# Patient Record
Sex: Female | Born: 1965 | Race: White | Hispanic: No | Marital: Single | Smoking: Current every day smoker
Health system: Southern US, Community
[De-identification: ages and names within clinical notes are randomized; demographics above are authoritative.]

## PROBLEM LIST (undated history)

## (undated) HISTORY — PX: SKIN GRAFT: SHX250

## (undated) HISTORY — PX: APPENDECTOMY: SHX54

---

## 2006-12-05 ENCOUNTER — Emergency Department (HOSPITAL_COMMUNITY): Admission: EM | Admit: 2006-12-05 | Discharge: 2006-12-05 | Payer: Self-pay | Admitting: Emergency Medicine

## 2007-03-09 ENCOUNTER — Emergency Department (HOSPITAL_COMMUNITY): Admission: EM | Admit: 2007-03-09 | Discharge: 2007-03-09 | Payer: Self-pay | Admitting: Emergency Medicine

## 2009-03-07 ENCOUNTER — Emergency Department (HOSPITAL_COMMUNITY): Admission: EM | Admit: 2009-03-07 | Discharge: 2009-03-07 | Payer: Self-pay | Admitting: Emergency Medicine

## 2009-03-10 ENCOUNTER — Emergency Department (HOSPITAL_COMMUNITY): Admission: EM | Admit: 2009-03-10 | Discharge: 2009-03-10 | Payer: Self-pay | Admitting: Family Medicine

## 2009-03-14 ENCOUNTER — Emergency Department (HOSPITAL_COMMUNITY): Admission: EM | Admit: 2009-03-14 | Discharge: 2009-03-14 | Payer: Self-pay | Admitting: Family Medicine

## 2009-03-21 ENCOUNTER — Emergency Department (HOSPITAL_COMMUNITY): Admission: EM | Admit: 2009-03-21 | Discharge: 2009-03-21 | Payer: Self-pay | Admitting: Emergency Medicine

## 2010-10-16 LAB — POCT URINE HEMOGLOBIN
Hgb urine dipstick: POSITIVE — AB
Operator id: 231701

## 2010-10-16 LAB — POCT PREGNANCY, URINE: Operator id: 231701

## 2014-06-01 ENCOUNTER — Emergency Department (HOSPITAL_BASED_OUTPATIENT_CLINIC_OR_DEPARTMENT_OTHER)
Admission: EM | Admit: 2014-06-01 | Discharge: 2014-06-01 | Disposition: A | Discharge status: Home / Self Care | Payer: Self-pay | Attending: Emergency Medicine | Admitting: Emergency Medicine

## 2014-06-01 ENCOUNTER — Encounter (HOSPITAL_BASED_OUTPATIENT_CLINIC_OR_DEPARTMENT_OTHER): Payer: Self-pay | Admitting: *Deleted

## 2014-06-01 DIAGNOSIS — R110 Nausea: Secondary | ICD-10-CM | POA: Insufficient documentation

## 2014-06-01 DIAGNOSIS — R197 Diarrhea, unspecified: Secondary | ICD-10-CM | POA: Insufficient documentation

## 2014-06-01 DIAGNOSIS — Z3202 Encounter for pregnancy test, result negative: Secondary | ICD-10-CM | POA: Insufficient documentation

## 2014-06-01 DIAGNOSIS — M791 Myalgia, unspecified site: Secondary | ICD-10-CM

## 2014-06-01 DIAGNOSIS — R51 Headache: Secondary | ICD-10-CM | POA: Insufficient documentation

## 2014-06-01 DIAGNOSIS — R5383 Other fatigue: Secondary | ICD-10-CM

## 2014-06-01 DIAGNOSIS — Z720 Tobacco use: Secondary | ICD-10-CM | POA: Insufficient documentation

## 2014-06-01 DIAGNOSIS — R6883 Chills (without fever): Secondary | ICD-10-CM | POA: Insufficient documentation

## 2014-06-01 DIAGNOSIS — R531 Weakness: Secondary | ICD-10-CM | POA: Insufficient documentation

## 2014-06-01 LAB — CBC WITH DIFFERENTIAL/PLATELET
BASOS ABS: 0.1 10*3/uL (ref 0.0–0.1)
Basophils Relative: 1 % (ref 0–1)
EOS PCT: 2 % (ref 0–5)
Eosinophils Absolute: 0.1 10*3/uL (ref 0.0–0.7)
HEMATOCRIT: 41.1 % (ref 36.0–46.0)
HEMOGLOBIN: 15 g/dL (ref 12.0–15.0)
LYMPHS PCT: 50 % — AB (ref 12–46)
Lymphs Abs: 4 10*3/uL (ref 0.7–4.0)
MCH: 38.2 pg — AB (ref 26.0–34.0)
MCHC: 36.5 g/dL — ABNORMAL HIGH (ref 30.0–36.0)
MCV: 104.6 fL — ABNORMAL HIGH (ref 78.0–100.0)
MONO ABS: 1 10*3/uL (ref 0.1–1.0)
MONOS PCT: 12 % (ref 3–12)
NEUTROS PCT: 35 % — AB (ref 43–77)
Neutro Abs: 2.8 10*3/uL (ref 1.7–7.7)
PLATELETS: 301 10*3/uL (ref 150–400)
RBC: 3.93 MIL/uL (ref 3.87–5.11)
RDW: 11.9 % (ref 11.5–15.5)
WBC: 7.9 10*3/uL (ref 4.0–10.5)

## 2014-06-01 LAB — URINE MICROSCOPIC-ADD ON

## 2014-06-01 LAB — COMPREHENSIVE METABOLIC PANEL
ALBUMIN: 4.2 g/dL (ref 3.5–5.0)
ALK PHOS: 79 U/L (ref 38–126)
ALT: 51 U/L (ref 14–54)
AST: 39 U/L (ref 15–41)
Anion gap: 9 (ref 5–15)
BUN: 15 mg/dL (ref 6–20)
CALCIUM: 9.3 mg/dL (ref 8.9–10.3)
CHLORIDE: 106 mmol/L (ref 101–111)
CO2: 24 mmol/L (ref 22–32)
Creatinine, Ser: 0.62 mg/dL (ref 0.44–1.00)
GFR calc Af Amer: >60 mL/min (ref >60)
GLUCOSE: 102 mg/dL — AB (ref 65–99)
Potassium: 4.1 mmol/L (ref 3.5–5.1)
Sodium: 139 mmol/L (ref 135–145)
TOTAL PROTEIN: 8.4 g/dL — AB (ref 6.5–8.1)
Total Bilirubin: 0.5 mg/dL (ref 0.3–1.2)

## 2014-06-01 LAB — URINALYSIS, ROUTINE W REFLEX MICROSCOPIC
Glucose, UA: NEGATIVE mg/dL
Hgb urine dipstick: NEGATIVE
Ketones, ur: 15 mg/dL — AB
Nitrite: NEGATIVE
PH: 6 (ref 5.0–8.0)
Protein, ur: NEGATIVE mg/dL
SPECIFIC GRAVITY, URINE: 1.038 — AB (ref 1.005–1.030)
UROBILINOGEN UA: 1 mg/dL (ref 0.0–1.0)

## 2014-06-01 LAB — PREGNANCY, URINE: PREG TEST UR: NEGATIVE

## 2014-06-01 MED ORDER — DOXYCYCLINE HYCLATE 100 MG PO CAPS: 100.0000 mg | ORAL_CAPSULE | Freq: Two times a day (BID) | ORAL | Status: DC

## 2014-06-01 NOTE — ED Provider Notes (Signed)
CSN: [id number]     Arrival date & time 06/01/14  1735 History   First MD Initiated Contact with Patient 06/01/14 1756     Chief Complaint  Patient presents with  . Generalized Body Aches     Patient is a 49 y.o. female presenting with weakness. The history is provided by the patient.  Weakness This is a new problem. The current episode started more than 2 days ago. The problem occurs daily. The problem has been gradually worsening. Associated symptoms include headaches. Pertinent negatives include no chest pain and no shortness of breath. The symptoms are aggravated by walking. The symptoms are relieved by rest.  pt reports fatigue, weakness, mild HA and "no energy" for past 4 days No fever/vomiting She reports nausea No CP is reported She reports tick bite about 10 days ago and symptoms started after that She has no other medical conditions   PMH- none  Past Surgical History  Procedure Laterality Date  . Skin graft    . Appendectomy     History reviewed. No pertinent family history. History  Substance Use Topics  . Smoking status: Current Every Day Smoker -- 0.50 packs/day    Types: Cigarettes  . Smokeless tobacco: Not on file  . Alcohol Use: No   OB History    No data available     Review of Systems  Constitutional: Positive for chills and fatigue.  Respiratory: Negative for cough and shortness of breath.   Cardiovascular: Negative for chest pain.  Gastrointestinal: Positive for diarrhea. Negative for vomiting.  Genitourinary: Negative for vaginal bleeding.  Neurological: Positive for weakness and headaches.  All other systems reviewed and are negative.     Allergies  Asa  Home Medications   Prior to Admission medications   Medication Sig Start Date End Date Taking? Authorizing Provider  acetaminophen (TYLENOL) 325 MG tablet Take 650 mg by mouth every 6 (six) hours as needed.   Yes Historical Provider, MD   BP 134/87 mmHg  Pulse 114  Temp(Src) 98.7 F  (37.1 C) (Oral)  Resp 16  Ht 5\' 6"  (1.676 m)  Wt 175 lb (79.379 kg)  BMI 28.26 kg/m2  SpO2 100% Physical Exam CONSTITUTIONAL: Well developed/well nourished HEAD: Normocephalic/atraumatic EYES: EOMI/PERRL ENMT: Mucous membranes moist NECK: supple no meningeal signs SPINE/BACK:entire spine nontender CV: S1/S2 noted, no murmurs/rubs/gallops noted LUNGS: Lungs are clear to auscultation bilaterally, no apparent distress ABDOMEN: soft, nontender, no rebound or guarding, bowel sounds noted throughout abdomen GU:no cva tenderness NEURO: Pt is awake/alert/appropriate, moves all extremitiesx4.  No facial droop.  No arm/leg drift.  No ataxia noted EXTREMITIES: pulses normal/equal, full ROM SKIN: warm, color normal PSYCH: no abnormalities of mood noted, alert and oriented to situation  ED Course  Procedures   7:05 PM Screening labs for fatigue ordered If negative will start treatment for possible RMSF, titers pending  Urine culture sent Pt well appearing and stable for d/c home   Labs Review Labs Reviewed  CBC WITH DIFFERENTIAL/PLATELET - Abnormal; Notable for the following:    MCV 104.6 (*)    MCH 38.2 (*)    MCHC 36.5 (*)    Neutrophils Relative % 35 (*)    Lymphocytes Relative 50 (*)    All other components within normal limits  COMPREHENSIVE METABOLIC PANEL - Abnormal; Notable for the following:    Glucose, Bld 102 (*)    Total Protein 8.4 (*)    All other components within normal limits  URINALYSIS, ROUTINE W REFLEX MICROSCOPIC (  NOT AT [facility]) - Abnormal; Notable for the following:    APPearance CLOUDY (*)    Specific Gravity, Urine 1.038 (*)    Bilirubin Urine SMALL (*)    Ketones, ur 15 (*)    Leukocytes, UA LARGE (*)    All other components within normal limits  URINE MICROSCOPIC-ADD ON - Abnormal; Notable for the following:    Squamous Epithelial / LPF MANY (*)    Bacteria, UA FEW (*)    All other components within normal limits  PREGNANCY, URINE  ROCKY MTN  SPOTTED FVR ABS PNL(IGG+IGM)      EKG Interpretation   Date/Time:  Wednesday Jun 01 2014 18:31:05 EDT Ventricular Rate:  89 PR Interval:  140 QRS Duration: 84 QT Interval:  362 QTC Calculation: 440 R Axis:   24 Text Interpretation:  Normal sinus rhythm Non-specific ST-t changes No  previous ECGs available Confirmed by Bebe Shaggy  MD, Dorinda Hill ([id number]) on  06/01/2014 6:57:55 PM      MDM   Final diagnoses:  Myalgia  Other fatigue    Nursing notes including past medical history and social history reviewed and considered in documentation Labs/vital reviewed myself and considered during evaluation     Zadie Rhine, MD 06/01/14 1924

## 2014-06-01 NOTE — ED Notes (Signed)
Pt c/o generalized body aches fever and chills x 4 days, tick removed x 10 days ago

## 2014-06-01 NOTE — Discharge Instructions (Signed)

## 2014-06-02 LAB — ROCKY MTN SPOTTED FVR ABS PNL(IGG+IGM)
RMSF IGM: 0.59 {index} (ref 0.00–0.89)
RMSF IgG: NEGATIVE

## 2014-06-03 LAB — URINE CULTURE: Colony Count: 50000

## 2015-05-17 ENCOUNTER — Emergency Department (HOSPITAL_BASED_OUTPATIENT_CLINIC_OR_DEPARTMENT_OTHER)
Admission: EM | Admit: 2015-05-17 | Discharge: 2015-05-17 | Disposition: A | Discharge status: Home / Self Care | Payer: Self-pay | Attending: Emergency Medicine | Admitting: Emergency Medicine

## 2015-05-17 ENCOUNTER — Emergency Department (HOSPITAL_BASED_OUTPATIENT_CLINIC_OR_DEPARTMENT_OTHER): Payer: Self-pay

## 2015-05-17 ENCOUNTER — Encounter (HOSPITAL_BASED_OUTPATIENT_CLINIC_OR_DEPARTMENT_OTHER): Payer: Self-pay | Admitting: *Deleted

## 2015-05-17 DIAGNOSIS — S39012A Strain of muscle, fascia and tendon of lower back, initial encounter: Secondary | ICD-10-CM | POA: Insufficient documentation

## 2015-05-17 DIAGNOSIS — Y929 Unspecified place or not applicable: Secondary | ICD-10-CM | POA: Insufficient documentation

## 2015-05-17 DIAGNOSIS — R200 Anesthesia of skin: Secondary | ICD-10-CM

## 2015-05-17 DIAGNOSIS — Y939 Activity, unspecified: Secondary | ICD-10-CM | POA: Insufficient documentation

## 2015-05-17 DIAGNOSIS — R202 Paresthesia of skin: Secondary | ICD-10-CM | POA: Insufficient documentation

## 2015-05-17 DIAGNOSIS — F17210 Nicotine dependence, cigarettes, uncomplicated: Secondary | ICD-10-CM | POA: Insufficient documentation

## 2015-05-17 DIAGNOSIS — W228XXA Striking against or struck by other objects, initial encounter: Secondary | ICD-10-CM | POA: Insufficient documentation

## 2015-05-17 DIAGNOSIS — Y999 Unspecified external cause status: Secondary | ICD-10-CM | POA: Insufficient documentation

## 2015-05-17 DIAGNOSIS — W19XXXA Unspecified fall, initial encounter: Secondary | ICD-10-CM

## 2015-05-17 DIAGNOSIS — M25512 Pain in left shoulder: Secondary | ICD-10-CM | POA: Insufficient documentation

## 2015-05-17 MED ORDER — HYDROCODONE-ACETAMINOPHEN 5-325 MG PO TABS: 1.0000 | ORAL_TABLET | Freq: Four times a day (QID) | ORAL | Status: AC | PRN

## 2015-05-17 MED ORDER — HYDROCODONE-ACETAMINOPHEN 5-325 MG PO TABS
1.0000 | ORAL_TABLET | Freq: Once | ORAL | Status: AC
  Administered 2015-05-17: 1 via ORAL
  Filled 2015-05-17: qty 1

## 2015-05-17 MED FILL — HYDROCODON-APAP 5-325: 5-325 | 2 days supply | Qty: 14 | Fill #0

## 2015-05-17 NOTE — ED Notes (Signed)
Pt amb to triage with quick steady gait in nad. Pt reports hitting her low back on door frame 2 weeks ago, has had low back pain shooting down her left leg, and left arm pain since then.

## 2015-05-17 NOTE — ED Provider Notes (Signed)
CSN: [id number]     Arrival date & time 05/17/15  1046 History   First MD Initiated Contact with Patient 05/17/15 1234     Chief Complaint  Patient presents with  . Back Pain     (Consider location/radiation/quality/duration/timing/severity/associated sxs/prior Treatment) Patient is a 50 y.o. female presenting with back pain. The history is provided by the patient.  Back Pain Associated symptoms: numbness   Associated symptoms: no abdominal pain, no chest pain, no fever, no headaches and no weakness   As post fall a week ago. Patient fell on a sliding door frame. On her buttocks. Following the fall had discomfort in her left shoulder and tingling and numbness and scattered pattern throughout the left arm. Also had pain from the lower part of the back radiating in both buttocks area. No numbness or weakness to her feet or legs. No neck pain. Not hit her head did not hit her neck. No headache. No loss of consciousness.  History reviewed. No pertinent past medical history. Past Surgical History  Procedure Laterality Date  . Skin graft    . Appendectomy     History reviewed. No pertinent family history. Social History  Substance Use Topics  . Smoking status: Current Every Day Smoker -- 0.50 packs/day    Types: Cigarettes  . Smokeless tobacco: None  . Alcohol Use: No   OB History    No data available     Review of Systems  Constitutional: Negative for fever.  HENT: Negative for congestion.   Eyes: Negative for visual disturbance.  Respiratory: Negative for shortness of breath.   Cardiovascular: Negative for chest pain.  Gastrointestinal: Negative for nausea, vomiting and abdominal pain.  Musculoskeletal: Positive for back pain. Negative for neck pain.  Skin: Negative for rash.  Neurological: Positive for numbness. Negative for syncope, weakness and headaches.  Hematological: Does not bruise/bleed easily.  Psychiatric/Behavioral: Negative for confusion.      Allergies   Asa  Home Medications   Prior to Admission medications   Medication Sig Start Date End Date Taking? Authorizing Provider  acetaminophen (TYLENOL) 325 MG tablet Take 650 mg by mouth every 6 (six) hours as needed.    Historical Provider, MD  HYDROcodone-acetaminophen (NORCO/VICODIN) 5-325 MG tablet Take 1-2 tablets by mouth every 6 (six) hours as needed for moderate pain. 05/17/15   Vanetta Mulders, MD   BP 144/92 mmHg  Pulse 110  Temp(Src) 98.5 F (36.9 C) (Oral)  Resp 16  Ht  (1.676 m)  Wt 70.761 kg  BMI 25.19 kg/m2  SpO2 99% Physical Exam  Constitutional: She is oriented to person, place, and time. She appears well-developed and well-nourished. No distress.  HENT:  Head: Normocephalic and atraumatic.  Mouth/Throat: Oropharynx is clear and moist.  Eyes: Conjunctivae and EOM are normal. Pupils are equal, round, and reactive to light.  Neck: Normal range of motion. Neck supple.  Neck with full range of motion no posterior neck tenderness.  Cardiovascular: Normal rate, regular rhythm and normal heart sounds.   No murmur heard. Pulmonary/Chest: Effort normal and breath sounds normal. No respiratory distress.  Abdominal: Soft. Bowel sounds are normal. There is no tenderness.  Musculoskeletal: Normal range of motion.  Tenderness to palpation of the lower part of the back around the coccyx.  Neurological: She is alert and oriented to person, place, and time. No cranial nerve deficit. She exhibits normal muscle tone. Coordination normal.  Except for some numbness and tingling to the left arm subjectively. Left arm motor  strength is strong at hand wrist low bit of a weakness at the elbow mostly associated with pain in the shoulder. Also some discomfort in the left shoulder with range of motion. Radial pulses 2+ . The numbness and tingling does not follow a radial ulnar or median nerve pattern.  Nursing note and vitals reviewed.   ED Course  Procedures (including critical care  time) Labs Review Labs Reviewed - No data to display  Imaging Review Dg Lumbar Spine Complete  05/17/2015  CLINICAL DATA:  Fall. EXAM: LUMBAR SPINE - COMPLETE 4+ VIEW COMPARISON:  CT scan of December 05, 2006. FINDINGS: No fracture or spondylolisthesis is noted. Mild degenerative disc disease is noted at L4-5. Minimal anterior osteophyte formation is noted at L3-4 and L4-5. Degenerative changes seen involving posterior facet joints of L5-S1. Atherosclerosis of abdominal aorta is noted. IMPRESSION: Degenerative changes as described above. No acute abnormality seen in the lumbar spine. Electronically Signed   By: Lupita RaiderJames  Green Jr, M.D.   On: 05/17/2015 13:29   Dg Shoulder Left  05/17/2015  CLINICAL DATA:  Left shoulder pain after fall 2 weeks ago. EXAM: LEFT SHOULDER - 2+ VIEW COMPARISON:  None. FINDINGS: There is no evidence of fracture or dislocation. There is no evidence of arthropathy or other focal bone abnormality. Soft tissues are unremarkable. IMPRESSION: Normal left shoulder. Electronically Signed   By: Lupita RaiderJames  Green Jr, M.D.   On: 05/17/2015 13:26   I have personally reviewed and evaluated these images and lab results as part of my medical decision-making.   EKG Interpretation None      MDM   Final diagnoses:  Fall, initial encounter  Lumbar strain, initial encounter  Shoulder pain, acute, left  Numbness and tingling in left arm   Patient status post fall over a week ago landing on her buttocks with pain at the lower part of the lumbar vertebrae. Pain shooting into the buttocks no lower extremity neurovascular or sensory abnormalities. No evidence of any sciatic nerve injury. Also following the fall patient has had left shoulder pain and numbness and tingling throughout most of the left arm. Following night brachial plexus kind of pattern. Radial pulses 2+ vascular spine hand strength is fine her strength is fine elbow strength is a little bit weak. As well some discomfort the with  movement of the shoulder. X-rays of the shoulder are negative for any deformity or dislocation or fracture.  I will treat tobacco with pain medicine will treat the left arm injury with a sling and follow-up with hand surgery possible brachial plexus involvement. No other concerning injuries.    Vanetta MuldersScott Jaena Brocato, MD 05/17/15 [phone]

## 2015-05-17 NOTE — Discharge Instructions (Signed)
X-rays of the left shoulder and low back without any bony injuries. Recommend follow-up well with hand surgery regarding potential nerve injury left arm. Use the sling for the left arm to rest the arm. Take pain medicine as needed.

## 2016-09-25 IMAGING — CR DG SHOULDER 2+V*L*
3 series · 3 of 3 positions shown · non-contrast
Comparison: None.

CLINICAL DATA: Left shoulder pain after fall 2 weeks ago.

EXAM:
LEFT SHOULDER - 2+ VIEW

[w shoulder grashey left]
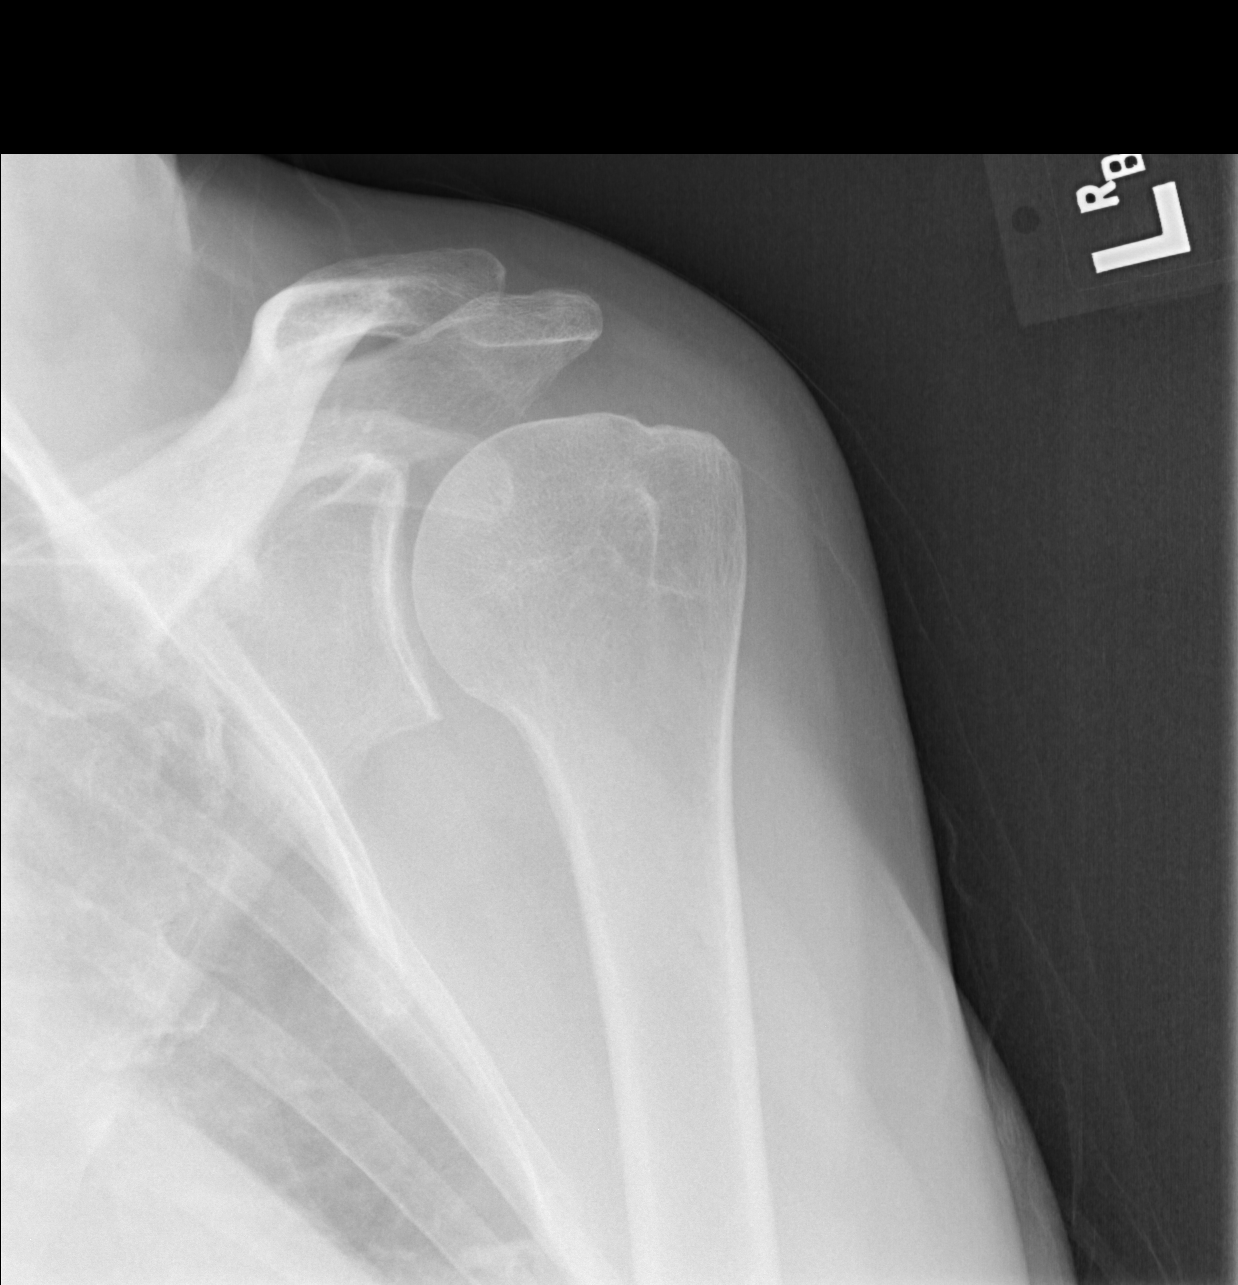

[w shoulder y view left]
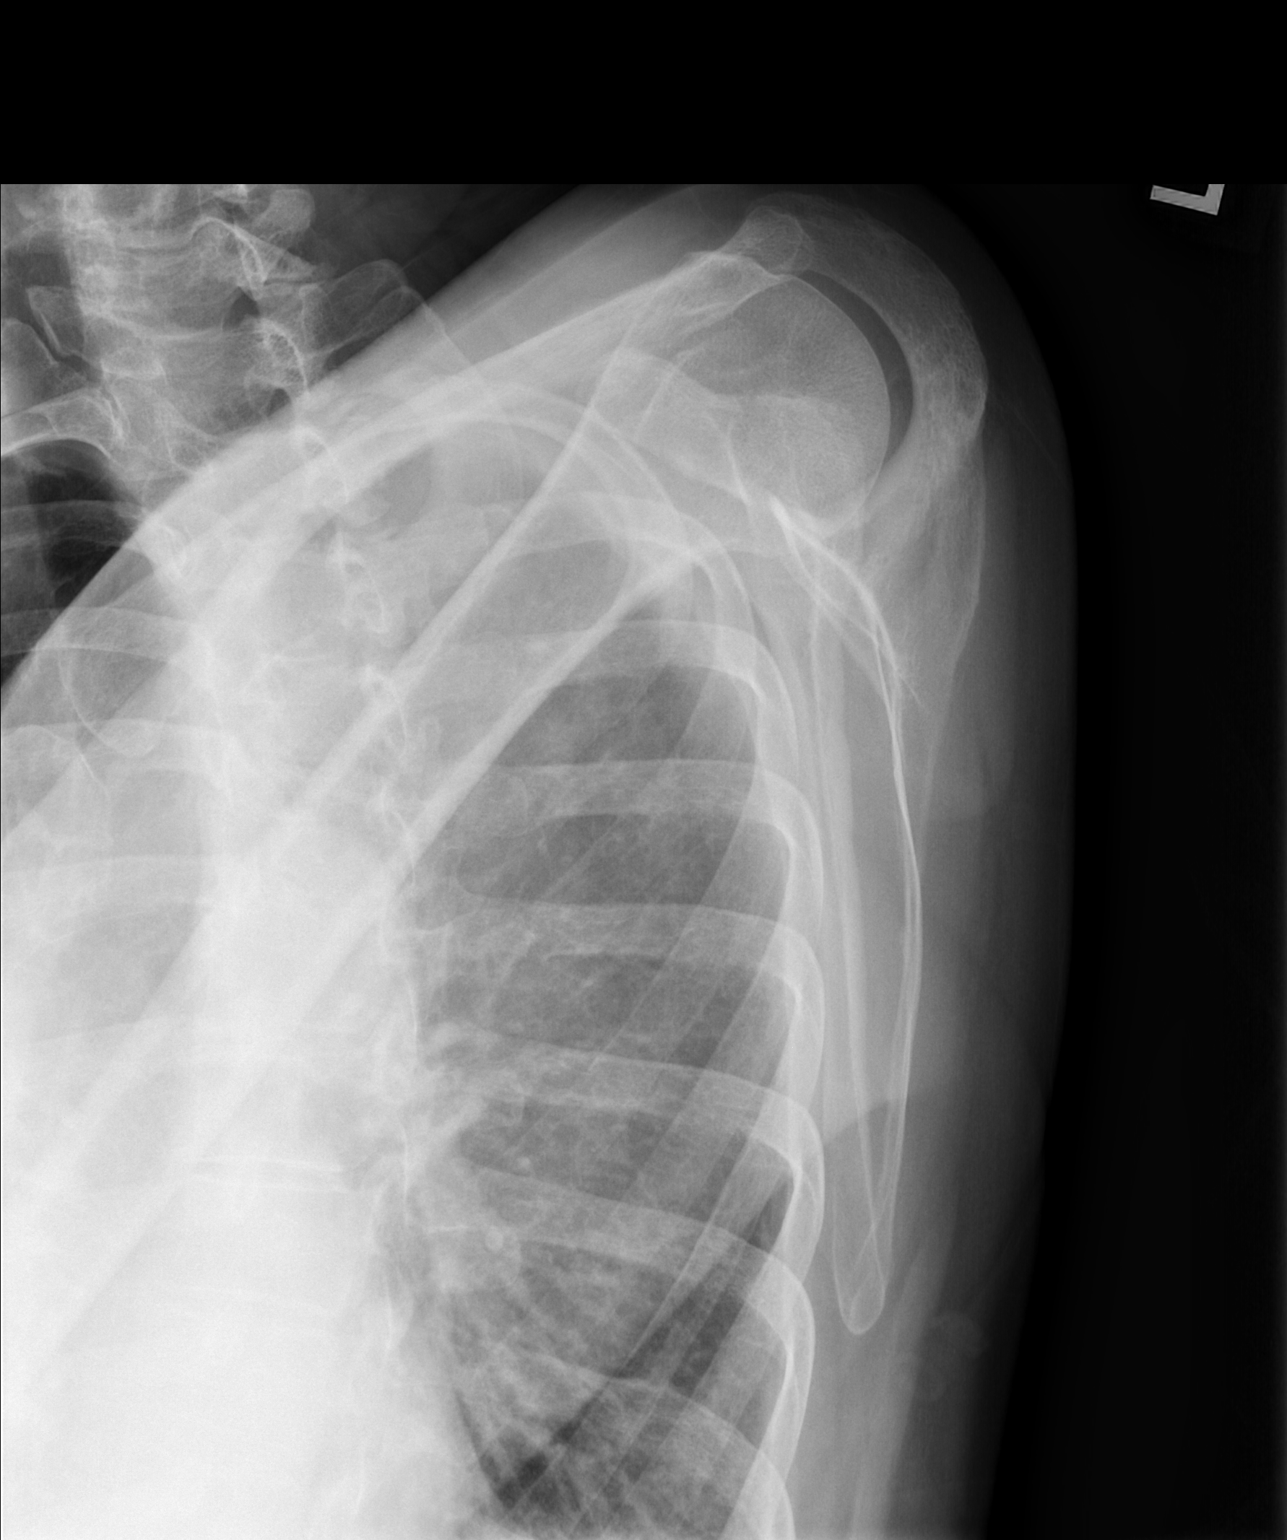

[x shoulder axillary left]
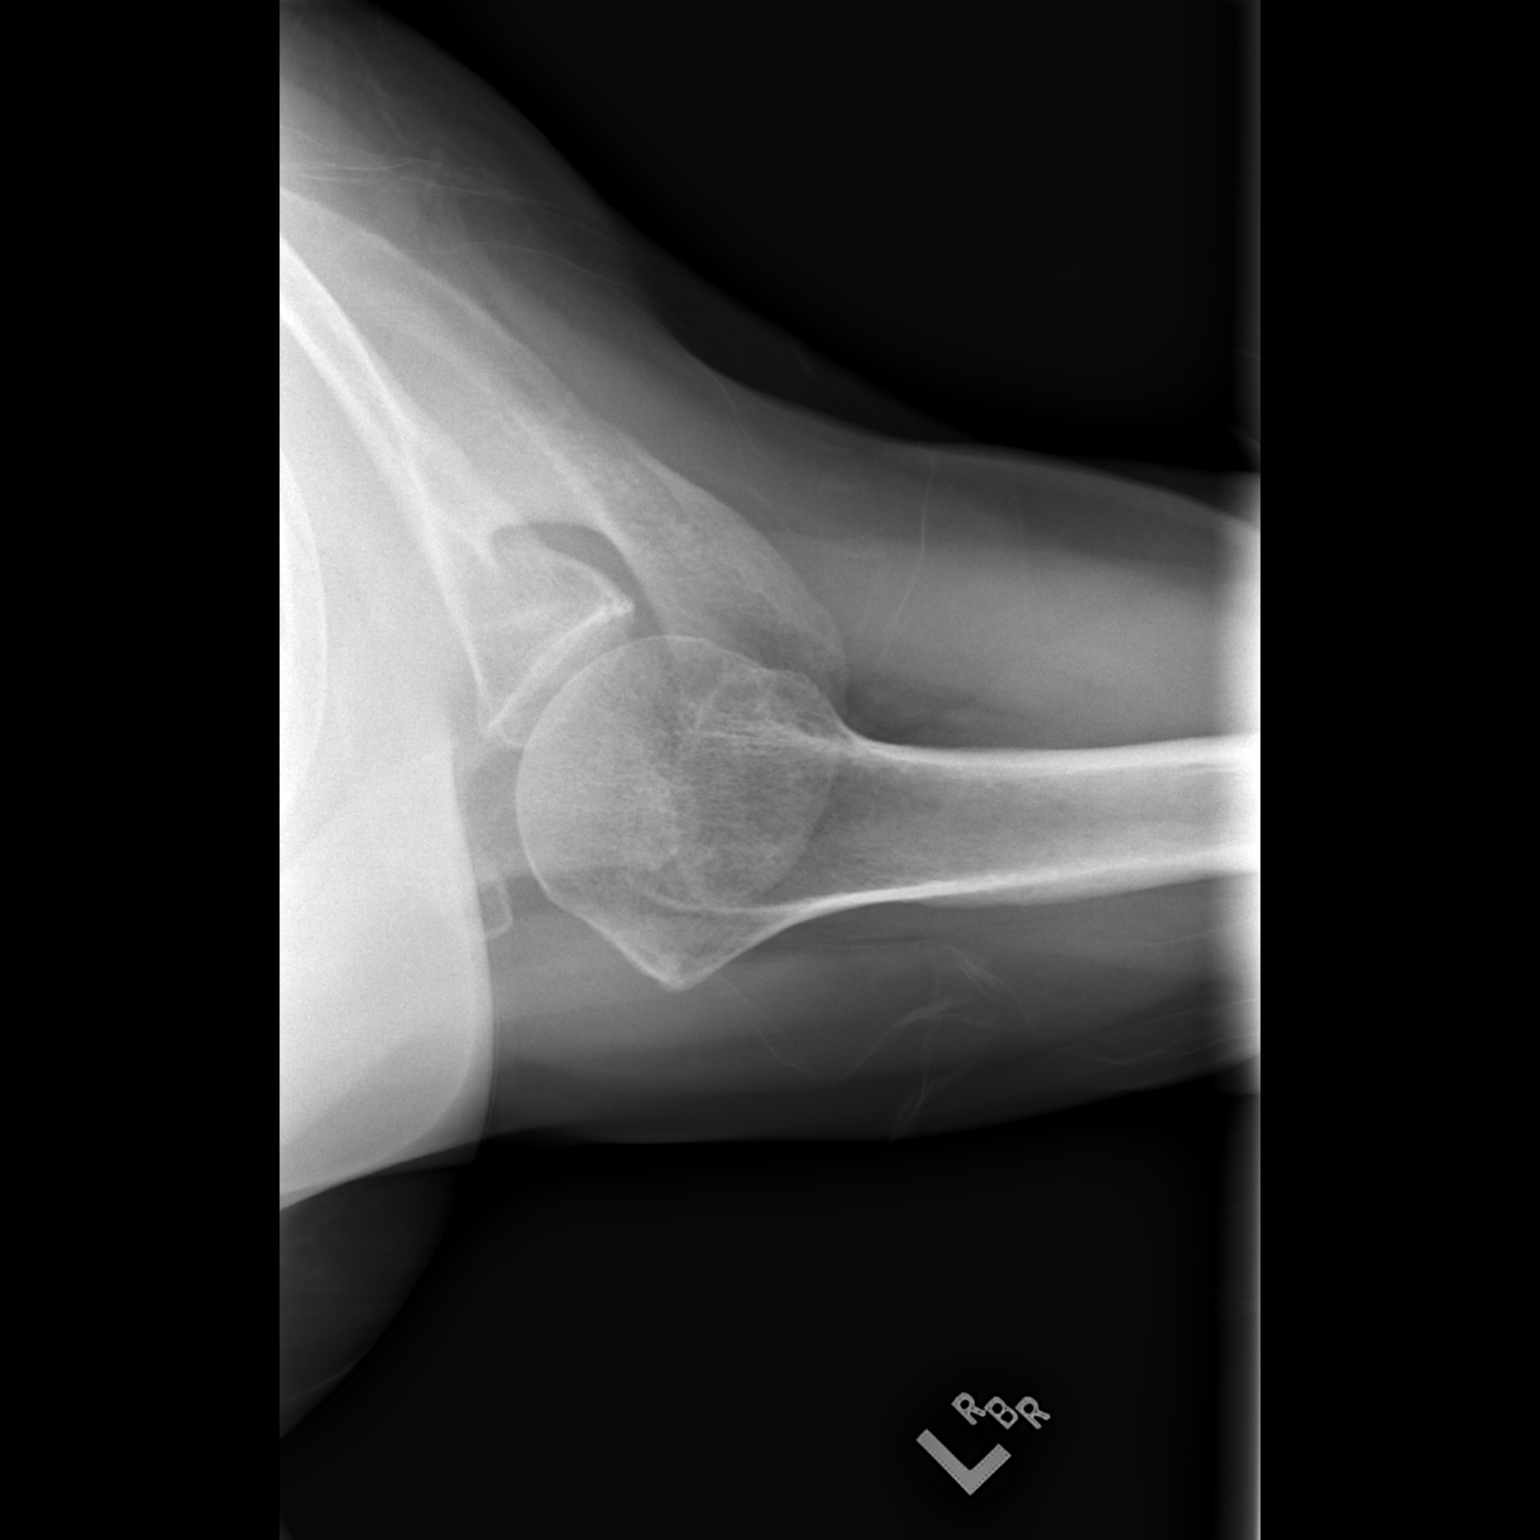

[3 of 3 positions shown; findings below may reference images not displayed]

FINDINGS: There is no evidence of fracture or dislocation. There is no
evidence of arthropathy or other focal bone abnormality. Soft
tissues are unremarkable.
IMPRESSION: Normal left shoulder.
